# Patient Record
Sex: Male | Born: 1975 | Race: White | Hispanic: No | Marital: Married | Smoking: Former smoker
Health system: Southern US, Community
[De-identification: ages and names within clinical notes are randomized; demographics above are authoritative.]

## PROBLEM LIST (undated history)

## (undated) DIAGNOSIS — E119 Type 2 diabetes mellitus without complications: Secondary | ICD-10-CM

---

## 2010-05-09 ENCOUNTER — Emergency Department (HOSPITAL_COMMUNITY)
Admission: EM | Admit: 2010-05-09 | Discharge: 2010-05-09 | Payer: Self-pay | Source: Home / Self Care | Admitting: Emergency Medicine

## 2010-07-28 ENCOUNTER — Emergency Department: Payer: Self-pay | Admitting: Emergency Medicine

## 2010-12-30 ENCOUNTER — Emergency Department: Payer: Self-pay | Admitting: Emergency Medicine

## 2011-06-28 IMAGING — CR DG SHOULDER 2+V*L*
3 series · 3 of 3 positions shown · non-contrast
Comparison: None

CLINICAL DATA: Fell from roof.

LEFT SHOULDER - 2+ VIEW

[w shoulder ap internal left]
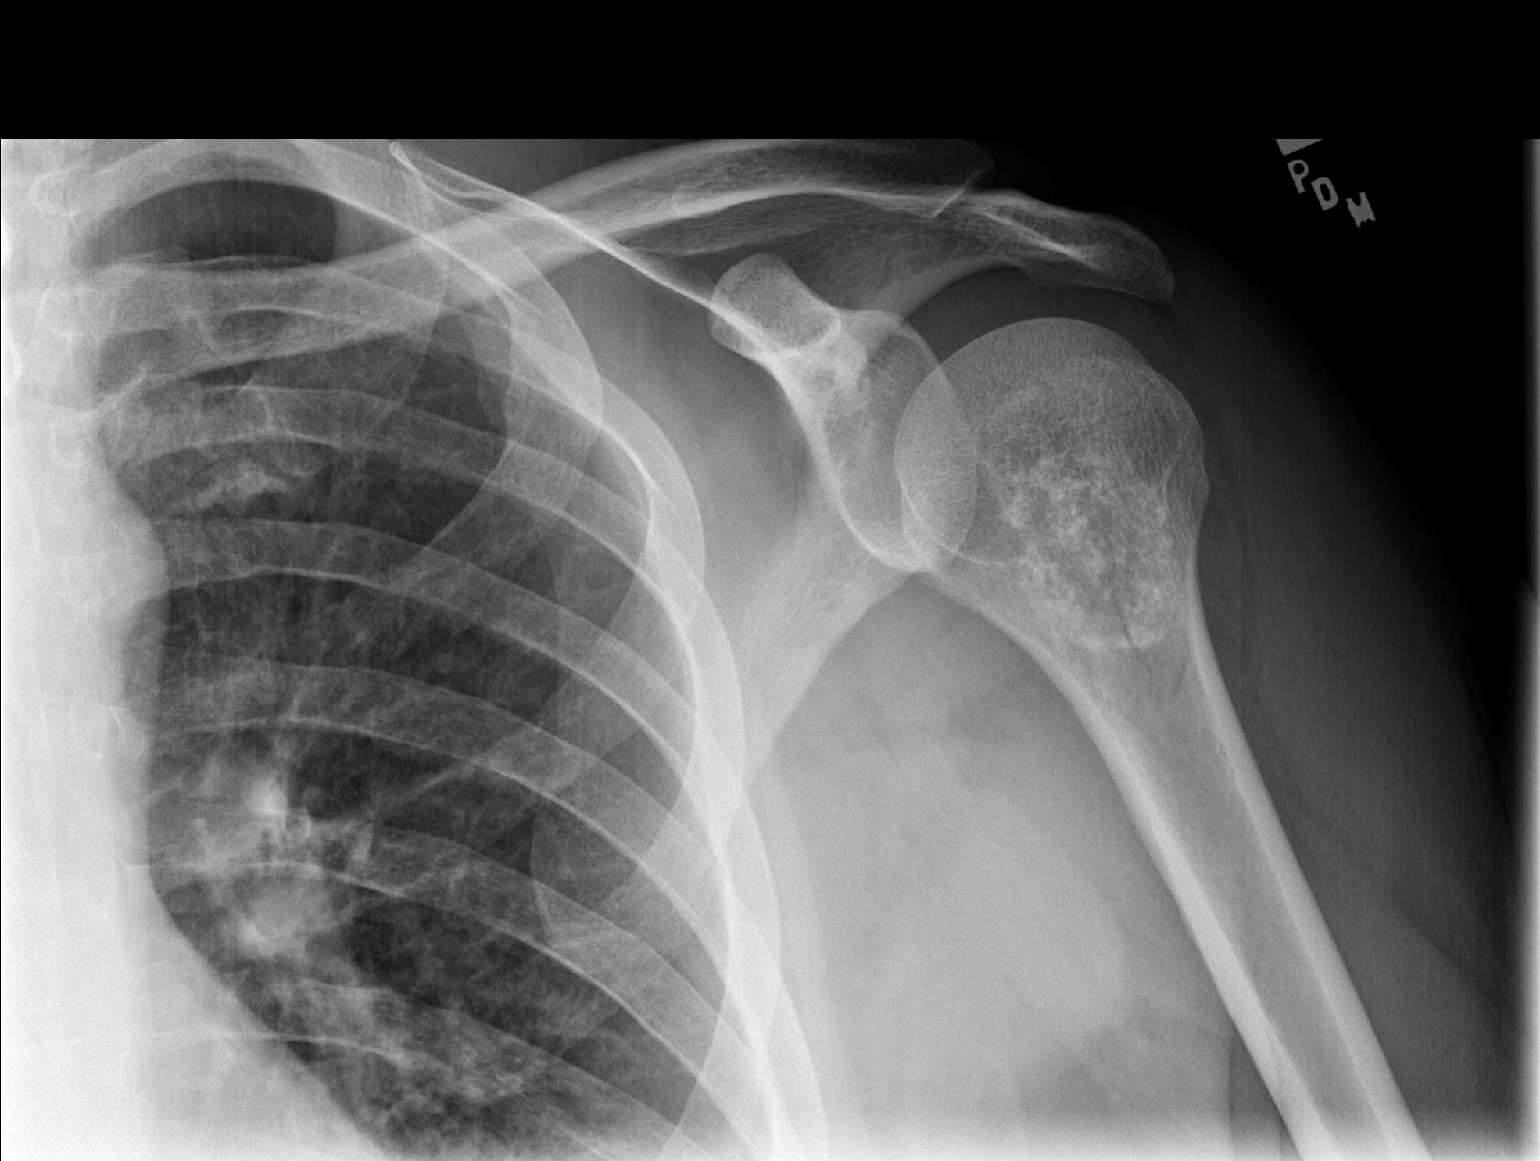

[w shoulder ap external left]
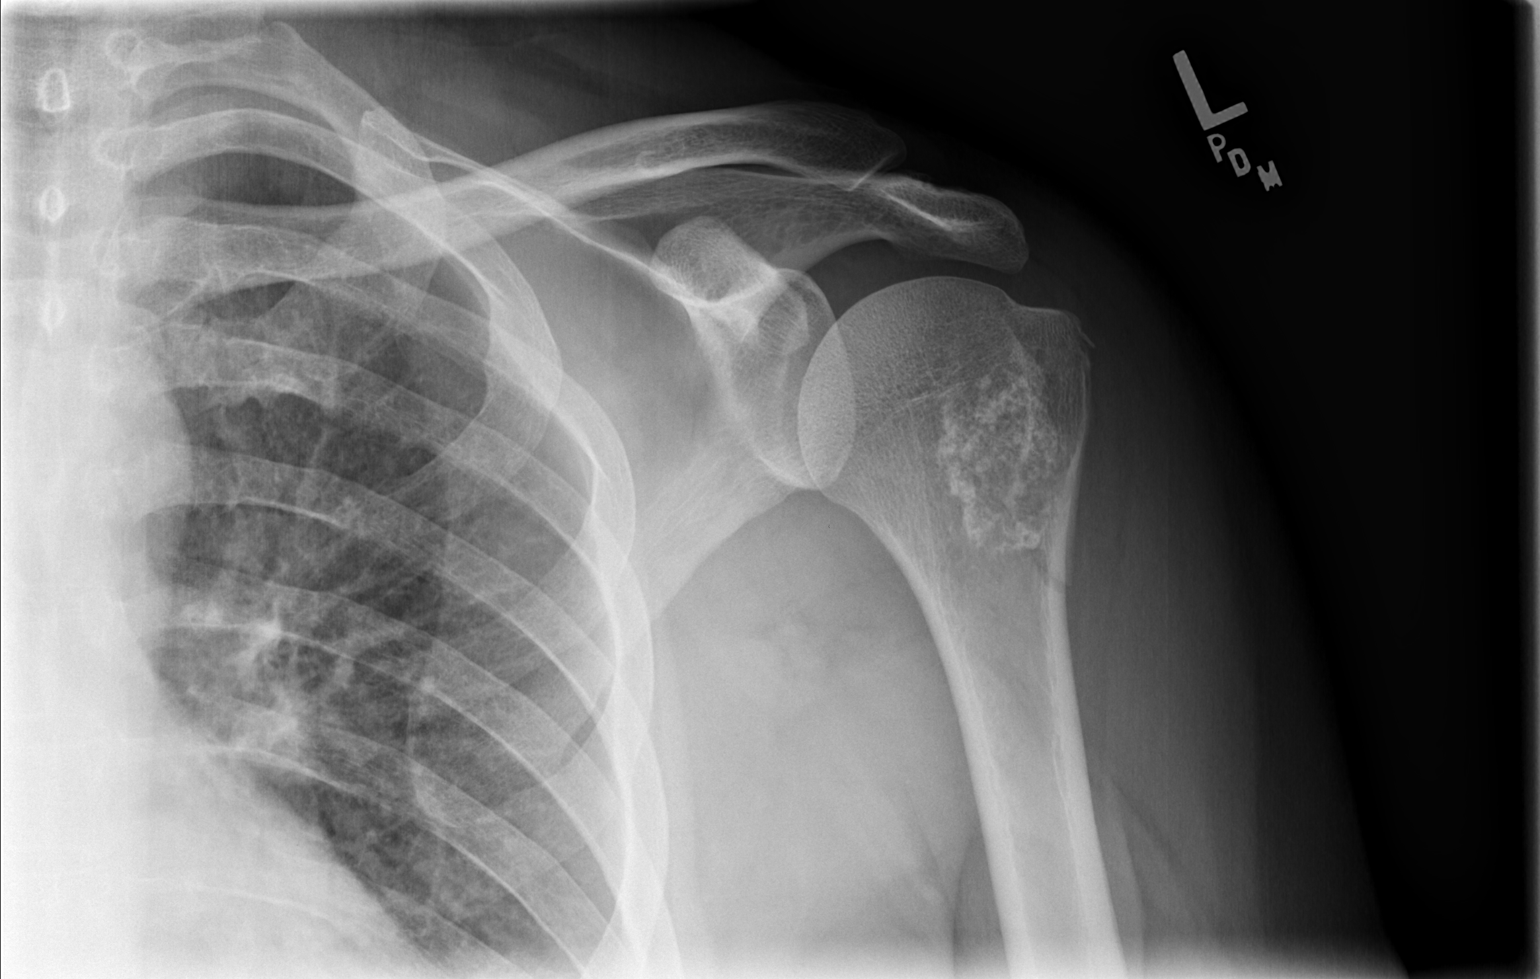

[w shoulder y view left]
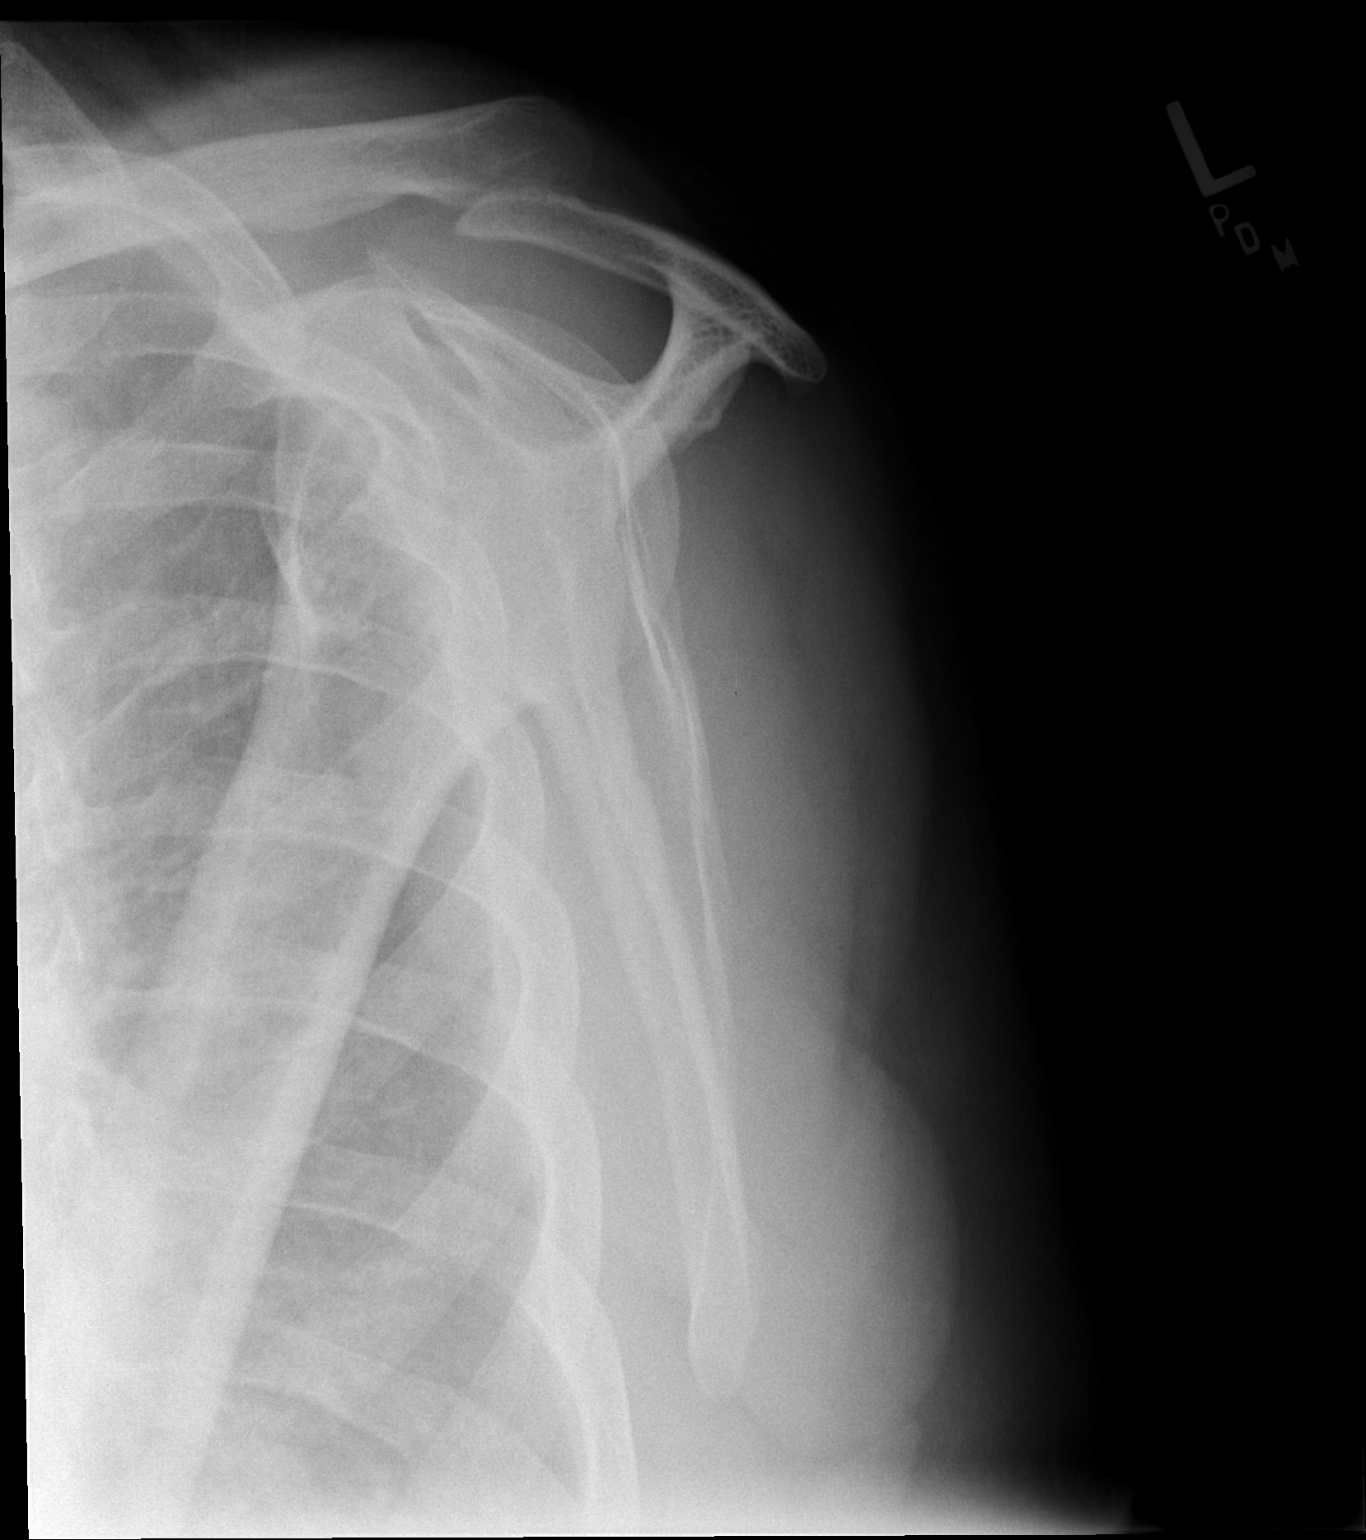

[3 of 3 positions shown; findings below may reference images not displayed]

FINDINGS: Normal alignment.  Avulsion fracture of the greater
tuberosity without displacement.  Underlying enchondroma versus
infarct in the proximal humerus.

Down sloping of the acromion.  The AC joint is intact.
IMPRESSION: Nondisplaced avulsion fracture of the greater tuberosity.  In
addition there is an enchondroma or bone infarct in the proximal
humerus.

## 2013-08-10 ENCOUNTER — Emergency Department: Payer: Self-pay | Admitting: Internal Medicine

## 2013-08-11 LAB — BETA STREP CULTURE(ARMC)

## 2014-04-21 ENCOUNTER — Emergency Department: Payer: Self-pay | Admitting: Emergency Medicine

## 2014-04-21 LAB — COMPREHENSIVE METABOLIC PANEL
ALT: 89 U/L — AB
Albumin: 3.7 g/dL (ref 3.4–5.0)
Alkaline Phosphatase: 185 U/L — ABNORMAL HIGH
Anion Gap: 12 (ref 7–16)
BILIRUBIN TOTAL: 0.6 mg/dL (ref 0.2–1.0)
BUN: 11 mg/dL (ref 7–18)
CHLORIDE: 91 mmol/L — AB (ref 98–107)
CO2: 24 mmol/L (ref 21–32)
CREATININE: 1.34 mg/dL — AB (ref 0.60–1.30)
Calcium, Total: 8.8 mg/dL (ref 8.5–10.1)
EGFR (African American): >60
Glucose: 835 mg/dL (ref 65–99)
OSMOLALITY: 296 (ref 275–301)
Potassium: 3.8 mmol/L (ref 3.5–5.1)
SGOT(AST): 25 U/L (ref 15–37)
Sodium: 127 mmol/L — ABNORMAL LOW (ref 136–145)
Total Protein: 7.6 g/dL (ref 6.4–8.2)

## 2014-04-21 LAB — CBC WITH DIFFERENTIAL/PLATELET
BASOS ABS: 0.1 10*3/uL (ref 0.0–0.1)
Basophil %: 1 %
EOS PCT: 2 %
Eosinophil #: 0.2 10*3/uL (ref 0.0–0.7)
HCT: 48.4 % (ref 40.0–52.0)
HGB: 16 g/dL (ref 13.0–18.0)
LYMPHS ABS: 2.3 10*3/uL (ref 1.0–3.6)
LYMPHS PCT: 29.2 %
MCH: 31.3 pg (ref 26.0–34.0)
MCHC: 33 g/dL (ref 32.0–36.0)
MCV: 95 fL (ref 80–100)
MONO ABS: 0.7 x10 3/mm (ref 0.2–1.0)
MONOS PCT: 9.1 %
NEUTROS PCT: 58.7 %
Neutrophil #: 4.5 10*3/uL (ref 1.4–6.5)
PLATELETS: 136 10*3/uL — AB (ref 150–440)
RBC: 5.1 10*6/uL (ref 4.40–5.90)
RDW: 12.4 % (ref 11.5–14.5)
WBC: 7.7 10*3/uL (ref 3.8–10.6)

## 2014-04-21 LAB — URINALYSIS, COMPLETE
BACTERIA: NONE SEEN
Bilirubin,UR: NEGATIVE
Blood: NEGATIVE
KETONE: NEGATIVE
Leukocyte Esterase: NEGATIVE
NITRITE: NEGATIVE
PH: 5 (ref 4.5–8.0)
PROTEIN: NEGATIVE
RBC,UR: 1 /HPF (ref 0–5)
Specific Gravity: 1.028 (ref 1.003–1.030)

## 2014-04-21 LAB — TROPONIN I

## 2015-10-25 ENCOUNTER — Emergency Department
Admission: EM | Admit: 2015-10-25 | Discharge: 2015-10-25 | Disposition: A | Discharge status: Home / Self Care | Payer: Self-pay | Attending: Emergency Medicine | Admitting: Emergency Medicine

## 2015-10-25 ENCOUNTER — Encounter: Payer: Self-pay | Admitting: Emergency Medicine

## 2015-10-25 DIAGNOSIS — L03119 Cellulitis of unspecified part of limb: Secondary | ICD-10-CM

## 2015-10-25 DIAGNOSIS — E119 Type 2 diabetes mellitus without complications: Secondary | ICD-10-CM | POA: Insufficient documentation

## 2015-10-25 DIAGNOSIS — B372 Candidiasis of skin and nail: Secondary | ICD-10-CM

## 2015-10-25 DIAGNOSIS — L03115 Cellulitis of right lower limb: Secondary | ICD-10-CM | POA: Insufficient documentation

## 2015-10-25 DIAGNOSIS — Z87891 Personal history of nicotine dependence: Secondary | ICD-10-CM | POA: Insufficient documentation

## 2015-10-25 DIAGNOSIS — B368 Other specified superficial mycoses: Secondary | ICD-10-CM | POA: Insufficient documentation

## 2015-10-25 DIAGNOSIS — L03116 Cellulitis of left lower limb: Secondary | ICD-10-CM | POA: Insufficient documentation

## 2015-10-25 HISTORY — DX: Type 2 diabetes mellitus without complications: E11.9

## 2015-10-25 MED ORDER — CEPHALEXIN 500 MG PO CAPS: 500.0000 mg | ORAL_CAPSULE | Freq: Three times a day (TID) | ORAL | Status: AC

## 2015-10-25 MED ORDER — CLOTRIMAZOLE-BETAMETHASONE 1-0.05 % EX CREA: 1.0000 "application " | TOPICAL_CREAM | Freq: Two times a day (BID) | CUTANEOUS | Status: AC

## 2015-10-25 MED ORDER — MUPIROCIN 2 % EX OINT: 1.0000 "application " | TOPICAL_OINTMENT | Freq: Three times a day (TID) | CUTANEOUS | Status: AC

## 2015-10-25 NOTE — ED Provider Notes (Signed)
[facility] Emergency Department Provider Note ____________________________________________  Time seen: 1915  I have reviewed the triage vital signs and the nursing notes.  HISTORY  Chief Complaint  Rash  HPI Sean Singleton is a 40 y.o. male resistance to the ED for evaluation of chronic rash of the bilateral lower extremities has been present for the last 2 months. 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 He does admit to work that requires him to be out in fields and tall grasses. He denies any particular contacts with plants, or any recent tick bites. He describes itchy irritated skin to the bilateral lower extremities primarily around the calves and distally towards his shins. He does note that his hightop work boots tend to aggravate the midportion of his shins circumferentially. In that same region, he has several circumscribed lesions that he reports are itchy and will often weep clear, yellowish fluid. Beyond that he has red, irritated, skin rash that appears to be itchy and scaly. He denies any interim fevers, chills, sweats. Denies any history of asthma, eczema, or atopic dermatitis. He has noted only short-term relief with over-the-counter hydrocortisone cream.  Past Medical History  Diagnosis Date  . Diabetes mellitus without complication (HCC)     There are no active problems to display for this patient.   History reviewed. No pertinent past surgical history.  Current Outpatient Rx  Name  Route  Sig  Dispense  Refill  . cephALEXin (KEFLEX) 500 MG capsule   Oral   Take 1 capsule (500 mg total) by mouth 3 (three) times daily.   21 capsule   0   . clotrimazole-betamethasone (LOTRISONE) cream   Topical   Apply 1 application topically 2 (two) times daily.   45 g   1   . mupirocin ointment (BACTROBAN) 2 %   Topical   Apply 1 application topically 3 (three) times daily.   22 g   1    Allergies Percocet  No  family history on file.  Social History Social History  Substance Use Topics  . Smoking status: Former Games developermoker  . Smokeless tobacco: None  . Alcohol Use: No   Review of Systems  Constitutional: Negative for fever. Eyes: Negative for visual changes. ENT: Negative for sore throat. Endocrine: Patient denies polydipsia, polyuria, or nocturia. Skin: Positive for rash. ____________________________________________  PHYSICAL EXAM:  VITAL SIGNS: ED Triage Vitals  Enc Vitals Group     BP 10/25/15 1835 139/81 mmHg     Pulse Rate 10/25/15 1835 94     Resp 10/25/15 1835 20     Temp 10/25/15 1835 98.1 F (36.7 C)     Temp Source 10/25/15 1835 Oral     SpO2 10/25/15 1835 96 %     Weight 10/25/15 1835 265 lb (120.203 kg)     Height 10/25/15 1835 5\' 8"  (1.727 m)     Head Cir --      Peak Flow --      Pain Score 10/25/15 1844 0     Pain Loc --      Pain Edu? --      Excl. in GC? --    Constitutional: Alert and oriented. Well appearing and in no distress. Head: Normocephalic and atraumatic. Cardiovascular: Normal distal pulses.  Respiratory: Normal respiratory effort.  Musculoskeletal: Nontender with normal range of motion in all extremities.  Neurologic:  Normal gait without ataxia. Normal speech and language. No gross focal neurologic deficits are  appreciated. Skin:  Skin is warm, dry and intact. Patient with well-demarcated circumferential rash to the bilateral lower legs. The circumferential erythematous consistent with the contact line of his hightop work boot. He has several scabbed nummular lesions within the circumferential area. No obvious weeping, induration, fluctuance, point tenderness appreciated. The patient also has some generalized erythematous maculopapular skin eruptions with scale and peeling noted primarily to the posterior lateral aspect of the calves going proximally towards the popliteal space. ____________________________________________  INITIAL IMPRESSION /  ASSESSMENT AND PLAN / ED COURSE  Patient with what appears to be a fungal dermatitis with some local cellulitis secondary to mechanical abrasion. He will be discharged with a prescription for Keflex as well as clobetasol to apply to the lower legs. He is also provided with a prescription for mupirocin on to apply to the more acute lesions caused by friction from his boots. [facility]. He should return to the ED as discussed for acutely worsening symptoms that include fever, increased redness, purulent drainage. ____________________________________________  FINAL CLINICAL IMPRESSION(S) / ED DIAGNOSES  Final diagnoses:  Yeast dermatitis  Cellulitis of lower extremity, unspecified laterality     Lissa HoardJenise V Bacon Tobie Perdue, PA-C 10/25/15 1959  Jennye MoccasinBrian S Quigley, MD 10/25/15 2029

## 2015-10-25 NOTE — ED Notes (Signed)
Patient to ED with c/o rash on bilateral lower legs x 2 months. Patient states that he has tried OTC creams without relief. Patient does not have PCP. Patient denies fever or chills.

## 2015-10-25 NOTE — Discharge Instructions (Signed)
Your exam reveals a probable yeast (fungal) infection to the skin. You also have areas which weep and crust, which may represent a superficial bacterial infection. Use the Lotrisone ointment to the back of the calf. Use the antibiotic ointment to the open wounds on the lower leg. Take the antibiotic as directed. Wash your body weekly with OTC dandruff shampoo as discussed. Keep the legs free of friction by using gauze or dressings. [facility] or return for increased redness, drainage, or fevers.   Cellulitis Cellulitis is an infection of the skin and the tissue under the skin. The infected area is usually red and tender. This happens most often in the arms and lower legs. HOME CARE   Take your antibiotic medicine as told. Finish the medicine even if you start to feel better.  Keep the infected arm or leg raised (elevated).  Put a warm cloth on the area up to 4 times per day.  Only take medicines as told by your doctor.  Keep all doctor visits as told. GET HELP IF:  You see red streaks on the skin coming from the infected area.  Your red area gets bigger or turns a dark color.  Your bone or joint under the infected area is painful after the skin heals.  Your infection comes back in the same area or different area.  You have a puffy (swollen) bump in the infected area.  You have new symptoms.  You have a fever. GET HELP RIGHT AWAY IF:   You feel very sleepy.  You throw up (vomit) or have watery poop (diarrhea).  You feel sick and have muscle aches and pains.   This information is not intended to replace advice given to you by your health care provider. Make sure you discuss any questions you have with your health care provider.   Document Released: 09/28/2007 Document Revised: 12/31/2014 Document Reviewed: 06/27/2011 Elsevier Interactive Patient Education 2016 [employer]  Cutaneous Candidiasis Cutaneous candidiasis is a condition in which there is an  overgrowth of yeast (candida) on the skin. Yeast normally live on the skin, but in small enough numbers not to cause any symptoms. In certain cases, increased growth of the yeast may cause an actual yeast infection. This kind of infection usually occurs in areas of the skin that are constantly warm and moist, such as the armpits or the groin. Yeast is the most common cause of diaper rash in babies and in people who cannot control their bowel movements (incontinence). CAUSES  The fungus that most often causes cutaneous candidiasis is Candida albicans. Conditions that can increase the risk of getting a yeast infection of the skin include:  Obesity.  Pregnancy.  Diabetes.  Taking antibiotic medicine.  Taking birth control pills.  Taking steroid medicines.  Thyroid disease.  An iron or zinc deficiency.  Problems with the immune system. SYMPTOMS   Red, swollen area of the skin.  Bumps on the skin.  Itchiness. DIAGNOSIS  The diagnosis of cutaneous candidiasis is usually based on its appearance. Light scrapings of the skin may also be taken and viewed under a microscope to identify the presence of yeast. TREATMENT  Antifungal creams may be applied to the infected skin. In severe cases, oral medicines may be needed.  HOME CARE INSTRUCTIONS   Keep your skin clean and dry.  Maintain a healthy weight.  If you have diabetes, keep your blood sugar under control. SEEK IMMEDIATE MEDICAL CARE IF:  Your rash continues to spread despite  treatment.  You have a fever, chills, or abdominal pain.   This information is not intended to replace advice given to you by your health care provider. Make sure you discuss any questions you have with your health care provider.   Document Released: 12/28/2010 Document Revised: 07/04/2011 Document Reviewed: 10/13/2014 Elsevier Interactive Patient Education [employer]
